# Patient Record
Sex: Female | Born: 2005 | Race: White | Hispanic: Yes | Marital: Single | Smoking: Never smoker
Health system: Southern US, Community
[De-identification: ages and names within clinical notes are randomized; demographics above are authoritative.]

## PROBLEM LIST (undated history)

## (undated) DIAGNOSIS — T7840XA Allergy, unspecified, initial encounter: Secondary | ICD-10-CM

## (undated) DIAGNOSIS — F909 Attention-deficit hyperactivity disorder, unspecified type: Secondary | ICD-10-CM

## (undated) DIAGNOSIS — S92301A Fracture of unspecified metatarsal bone(s), right foot, initial encounter for closed fracture: Secondary | ICD-10-CM

## (undated) HISTORY — DX: Attention-deficit hyperactivity disorder, unspecified type: F90.9

## (undated) HISTORY — DX: Allergy, unspecified, initial encounter: T78.40XA

## (undated) HISTORY — DX: Fracture of unspecified metatarsal bone(s), right foot, initial encounter for closed fracture: S92.301A

---
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
## 2023-01-19 DIAGNOSIS — R42 Dizziness and giddiness: Secondary | ICD-10-CM | POA: Diagnosis not present

## 2023-01-19 DIAGNOSIS — R531 Weakness: Secondary | ICD-10-CM | POA: Diagnosis not present

## 2023-01-19 DIAGNOSIS — G43109 Migraine with aura, not intractable, without status migrainosus: Secondary | ICD-10-CM | POA: Diagnosis not present

## 2023-01-19 DIAGNOSIS — F909 Attention-deficit hyperactivity disorder, unspecified type: Secondary | ICD-10-CM | POA: Diagnosis not present

## 2023-01-19 DIAGNOSIS — R519 Headache, unspecified: Secondary | ICD-10-CM | POA: Diagnosis not present

## 2023-04-05 DIAGNOSIS — Z20822 Contact with and (suspected) exposure to covid-19: Secondary | ICD-10-CM | POA: Diagnosis not present

## 2023-04-05 DIAGNOSIS — R070 Pain in throat: Secondary | ICD-10-CM | POA: Diagnosis not present

## 2023-04-05 DIAGNOSIS — B349 Viral infection, unspecified: Secondary | ICD-10-CM | POA: Diagnosis not present

## 2023-04-08 DIAGNOSIS — Z20822 Contact with and (suspected) exposure to covid-19: Secondary | ICD-10-CM | POA: Diagnosis not present

## 2023-04-08 DIAGNOSIS — B349 Viral infection, unspecified: Secondary | ICD-10-CM | POA: Diagnosis not present

## 2023-08-04 DIAGNOSIS — A084 Viral intestinal infection, unspecified: Secondary | ICD-10-CM | POA: Diagnosis not present

## 2023-08-11 DIAGNOSIS — R300 Dysuria: Secondary | ICD-10-CM | POA: Diagnosis not present

## 2023-08-11 DIAGNOSIS — N390 Urinary tract infection, site not specified: Secondary | ICD-10-CM | POA: Diagnosis not present

## 2023-08-15 DIAGNOSIS — N390 Urinary tract infection, site not specified: Secondary | ICD-10-CM | POA: Diagnosis not present

## 2023-08-15 DIAGNOSIS — R300 Dysuria: Secondary | ICD-10-CM | POA: Diagnosis not present

## 2023-11-10 IMAGING — US US RENAL
1 series · 14 of 25 positions shown · non-contrast
Comparison: None.

CLINICAL DATA: Recurrent urinary tract infection

EXAM:
RENAL / URINARY TRACT ULTRASOUND COMPLETE

[Series 1: us renal · 14 of 72 slices shown]
[im 1/72]
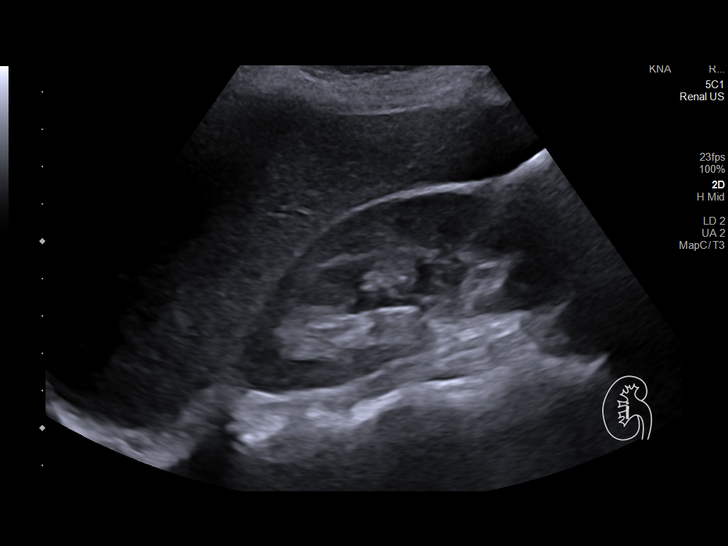
[im 6/72]
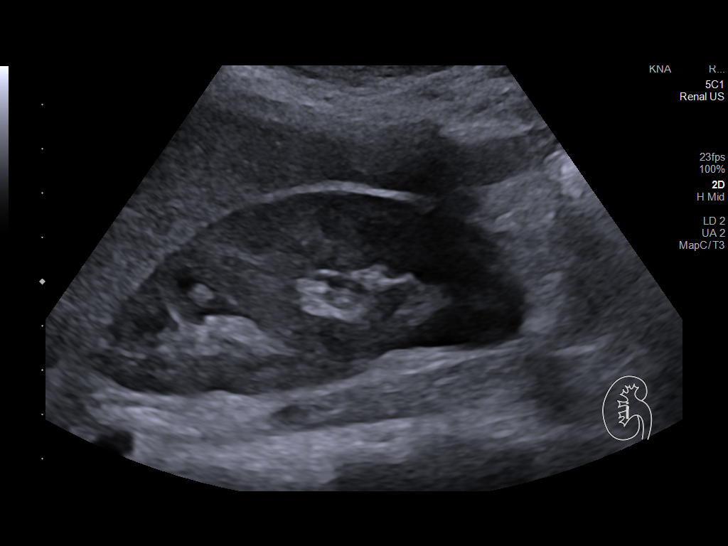
[im 12/72]
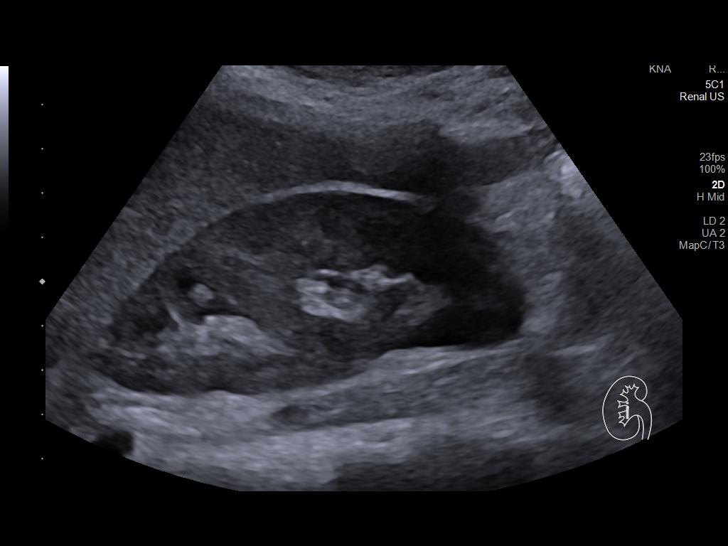
[im 18/72]
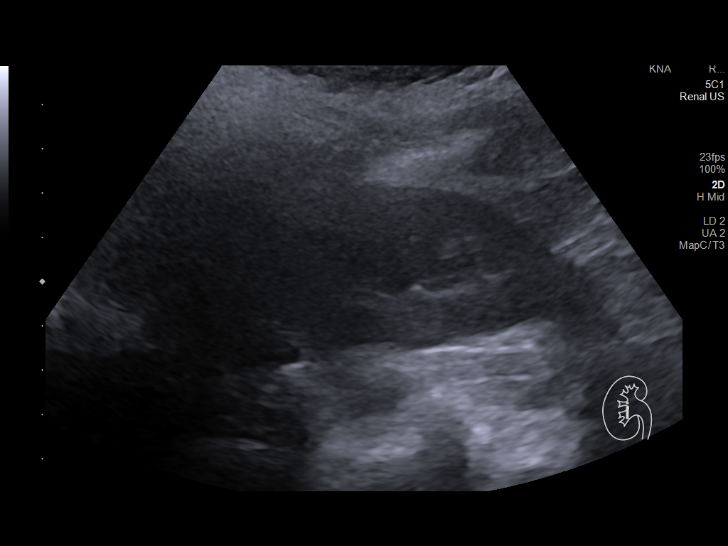
[im 24/72]
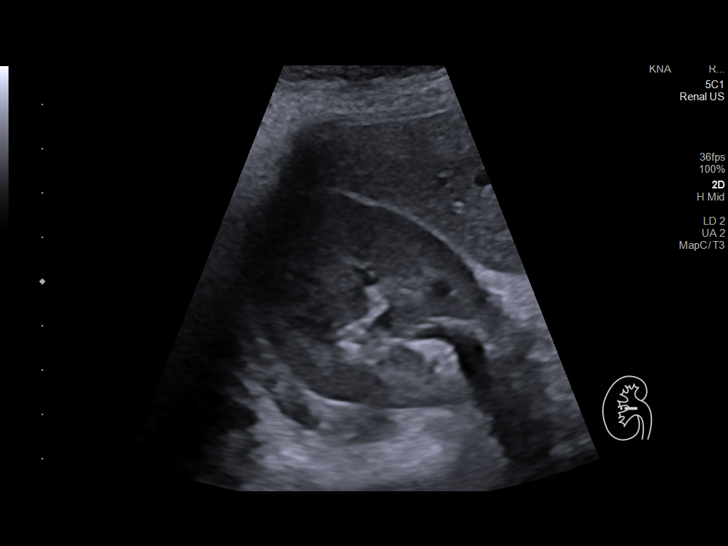
[im 27/72]
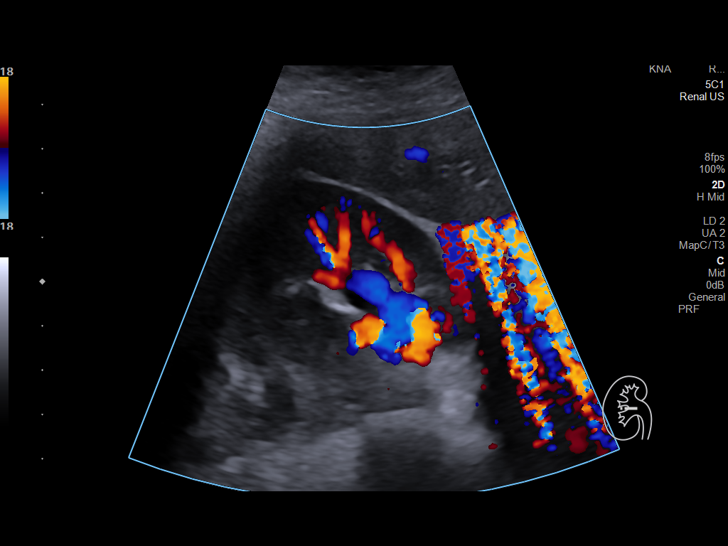
[im 33/72]
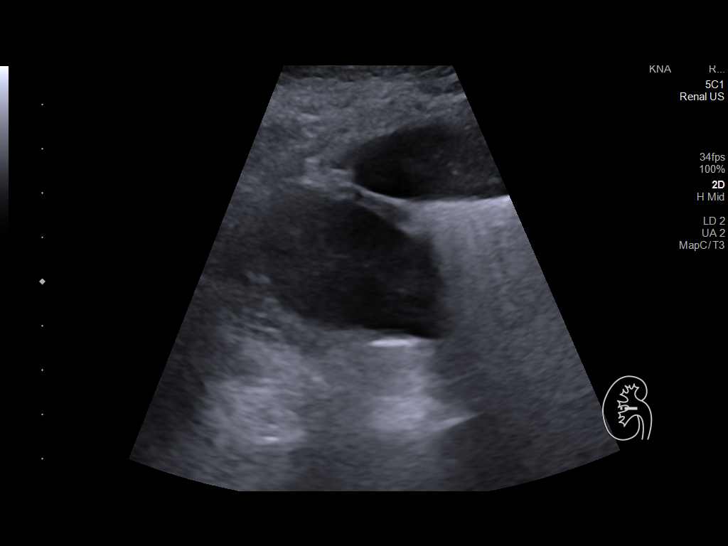
[im 39/72]
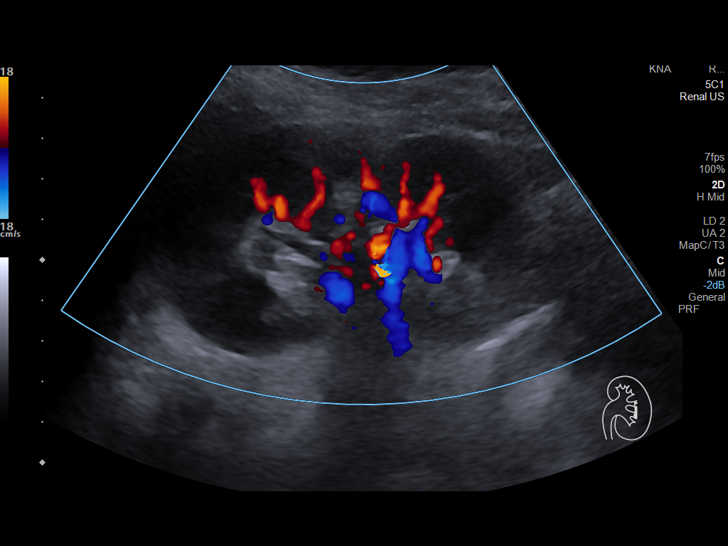
[im 45/72]
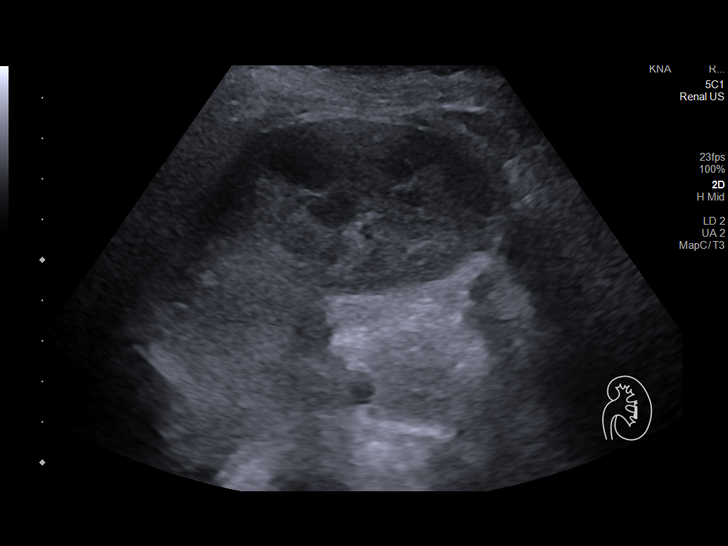
[im 48/72]
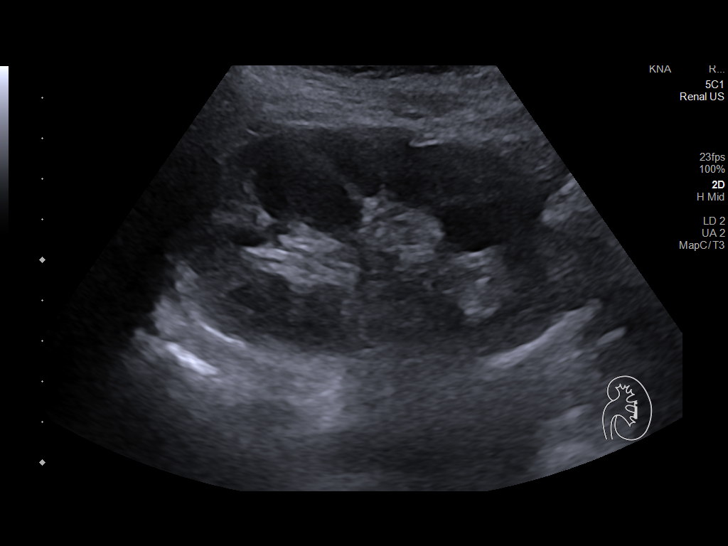
[im 54/72]
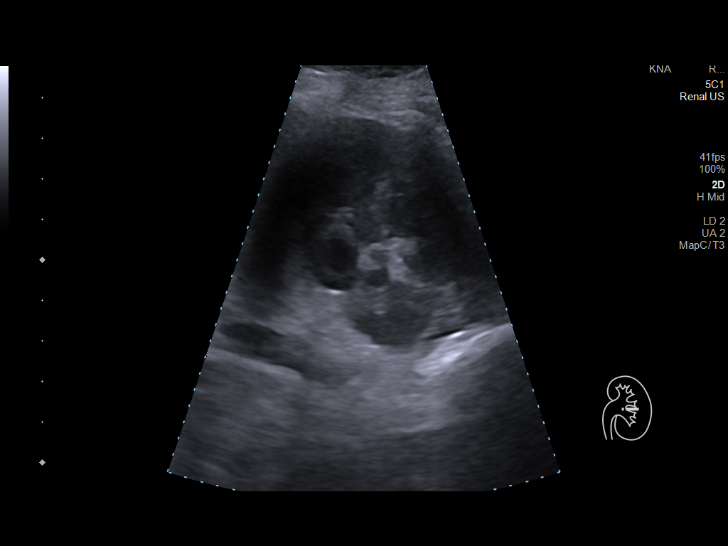
[im 60/72]
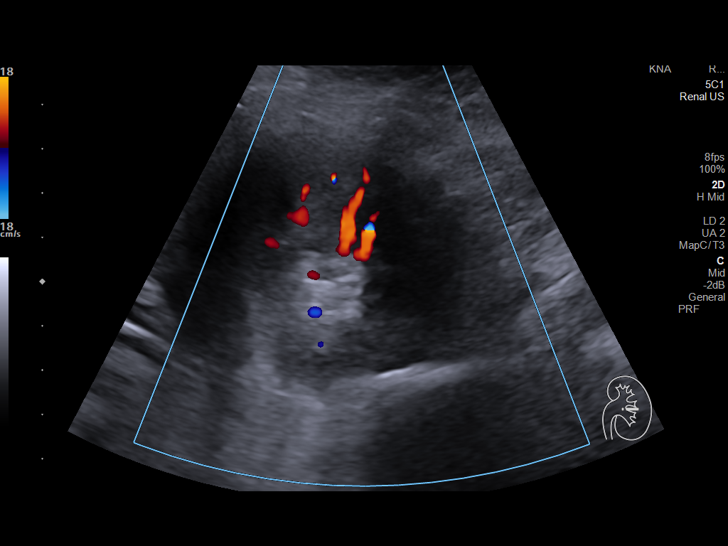
[im 66/72]
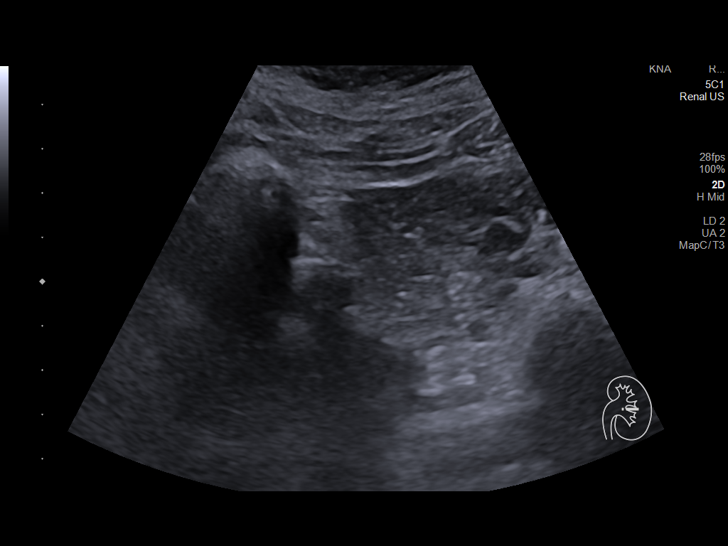
[im 72/72]
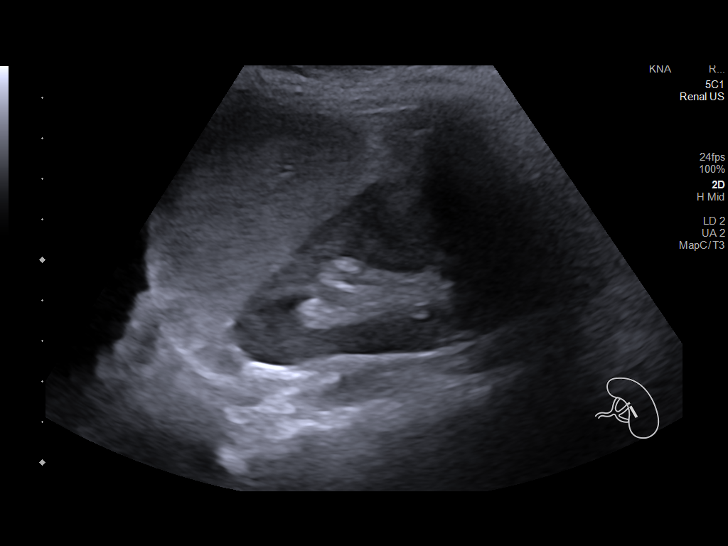

[14 of 25 positions shown; findings below may reference images not displayed]

FINDINGS: Right Kidney:

Renal measurements: 9.8 x 4.4 x 5.3 cm = volume: 119 mL.
Echogenicity within normal limits. No mass or hydronephrosis
visualized.

Left Kidney:

Renal measurements: 10.7 x 6.0 x 4.3 cm = volume: 144 mL.
Echogenicity within normal limits. No mass or hydronephrosis
visualized.

Bladder:

Appears normal for degree of bladder distention.

Other:

None.
IMPRESSION: Normal renal sonogram

## 2023-11-12 DIAGNOSIS — R519 Headache, unspecified: Secondary | ICD-10-CM | POA: Diagnosis not present

## 2023-11-12 DIAGNOSIS — J00 Acute nasopharyngitis [common cold]: Secondary | ICD-10-CM | POA: Diagnosis not present

## 2023-11-12 DIAGNOSIS — Z20822 Contact with and (suspected) exposure to covid-19: Secondary | ICD-10-CM | POA: Diagnosis not present

## 2023-11-12 DIAGNOSIS — B349 Viral infection, unspecified: Secondary | ICD-10-CM | POA: Diagnosis not present

## 2023-12-09 DIAGNOSIS — R300 Dysuria: Secondary | ICD-10-CM | POA: Diagnosis not present

## 2023-12-09 DIAGNOSIS — R112 Nausea with vomiting, unspecified: Secondary | ICD-10-CM | POA: Diagnosis not present

## 2023-12-09 DIAGNOSIS — M549 Dorsalgia, unspecified: Secondary | ICD-10-CM | POA: Diagnosis not present

## 2023-12-09 DIAGNOSIS — Z793 Long term (current) use of hormonal contraceptives: Secondary | ICD-10-CM | POA: Diagnosis not present

## 2023-12-09 DIAGNOSIS — Z20822 Contact with and (suspected) exposure to covid-19: Secondary | ICD-10-CM | POA: Diagnosis not present

## 2023-12-09 DIAGNOSIS — B3731 Acute candidiasis of vulva and vagina: Secondary | ICD-10-CM | POA: Diagnosis not present

## 2023-12-17 DIAGNOSIS — Z0000 Encounter for general adult medical examination without abnormal findings: Secondary | ICD-10-CM | POA: Diagnosis not present

## 2023-12-26 DIAGNOSIS — G43909 Migraine, unspecified, not intractable, without status migrainosus: Secondary | ICD-10-CM | POA: Diagnosis not present

## 2023-12-31 DIAGNOSIS — M791 Myalgia, unspecified site: Secondary | ICD-10-CM | POA: Diagnosis not present

## 2023-12-31 DIAGNOSIS — R112 Nausea with vomiting, unspecified: Secondary | ICD-10-CM | POA: Diagnosis not present

## 2023-12-31 DIAGNOSIS — R519 Headache, unspecified: Secondary | ICD-10-CM | POA: Diagnosis not present

## 2023-12-31 DIAGNOSIS — Z20822 Contact with and (suspected) exposure to covid-19: Secondary | ICD-10-CM | POA: Diagnosis not present

## 2024-03-22 DIAGNOSIS — R070 Pain in throat: Secondary | ICD-10-CM | POA: Diagnosis not present

## 2024-03-22 DIAGNOSIS — R112 Nausea with vomiting, unspecified: Secondary | ICD-10-CM | POA: Diagnosis not present

## 2024-03-22 DIAGNOSIS — Z20822 Contact with and (suspected) exposure to covid-19: Secondary | ICD-10-CM | POA: Diagnosis not present

## 2024-03-22 DIAGNOSIS — G43809 Other migraine, not intractable, without status migrainosus: Secondary | ICD-10-CM | POA: Diagnosis not present

## 2024-03-22 DIAGNOSIS — B349 Viral infection, unspecified: Secondary | ICD-10-CM | POA: Diagnosis not present

## 2024-04-18 DIAGNOSIS — Z20822 Contact with and (suspected) exposure to covid-19: Secondary | ICD-10-CM | POA: Diagnosis not present

## 2024-04-18 DIAGNOSIS — J069 Acute upper respiratory infection, unspecified: Secondary | ICD-10-CM | POA: Diagnosis not present

## 2024-04-18 DIAGNOSIS — R070 Pain in throat: Secondary | ICD-10-CM | POA: Diagnosis not present

## 2024-05-23 DIAGNOSIS — Z0000 Encounter for general adult medical examination without abnormal findings: Secondary | ICD-10-CM | POA: Diagnosis not present

## 2024-05-29 DIAGNOSIS — F909 Attention-deficit hyperactivity disorder, unspecified type: Secondary | ICD-10-CM | POA: Diagnosis not present

## 2024-05-29 DIAGNOSIS — G43909 Migraine, unspecified, not intractable, without status migrainosus: Secondary | ICD-10-CM | POA: Diagnosis not present

## 2024-05-29 DIAGNOSIS — J028 Acute pharyngitis due to other specified organisms: Secondary | ICD-10-CM | POA: Diagnosis not present

## 2024-05-29 DIAGNOSIS — B9789 Other viral agents as the cause of diseases classified elsewhere: Secondary | ICD-10-CM | POA: Diagnosis not present

## 2024-05-29 DIAGNOSIS — J029 Acute pharyngitis, unspecified: Secondary | ICD-10-CM | POA: Diagnosis not present

## 2024-06-03 DIAGNOSIS — J029 Acute pharyngitis, unspecified: Secondary | ICD-10-CM | POA: Diagnosis not present

## 2024-07-25 DIAGNOSIS — R519 Headache, unspecified: Secondary | ICD-10-CM | POA: Diagnosis not present

## 2024-07-25 DIAGNOSIS — J029 Acute pharyngitis, unspecified: Secondary | ICD-10-CM | POA: Diagnosis not present

## 2024-07-26 DIAGNOSIS — J019 Acute sinusitis, unspecified: Secondary | ICD-10-CM | POA: Diagnosis not present

## 2024-07-26 DIAGNOSIS — R519 Headache, unspecified: Secondary | ICD-10-CM | POA: Diagnosis not present

## 2024-07-26 DIAGNOSIS — J111 Influenza due to unidentified influenza virus with other respiratory manifestations: Secondary | ICD-10-CM | POA: Diagnosis not present
